# Patient Record
Sex: Male | Born: 1975 | Hispanic: No | Marital: Married | Smoking: Never smoker
Health system: Southern US, Community
[De-identification: ages and names within clinical notes are randomized; demographics above are authoritative.]

## PROBLEM LIST (undated history)

## (undated) HISTORY — PX: INNER EAR SURGERY: SHX679

---

## 2019-07-12 ENCOUNTER — Encounter (HOSPITAL_COMMUNITY): Payer: Self-pay | Admitting: Emergency Medicine

## 2019-07-12 ENCOUNTER — Emergency Department (HOSPITAL_COMMUNITY)
Admission: EM | Admit: 2019-07-12 | Discharge: 2019-07-12 | Disposition: A | Discharge status: Home / Self Care | Payer: Self-pay | Attending: Emergency Medicine | Admitting: Emergency Medicine

## 2019-07-12 ENCOUNTER — Emergency Department (HOSPITAL_COMMUNITY): Payer: Self-pay

## 2019-07-12 ENCOUNTER — Other Ambulatory Visit: Payer: Self-pay

## 2019-07-12 DIAGNOSIS — R05 Cough: Secondary | ICD-10-CM

## 2019-07-12 DIAGNOSIS — U071 COVID-19: Secondary | ICD-10-CM | POA: Insufficient documentation

## 2019-07-12 DIAGNOSIS — R0602 Shortness of breath: Secondary | ICD-10-CM | POA: Insufficient documentation

## 2019-07-12 DIAGNOSIS — R059 Cough, unspecified: Secondary | ICD-10-CM

## 2019-07-12 MED ORDER — HYDROCOD POLST-CPM POLST ER 10-8 MG/5ML PO SUER
5.0000 mL | Freq: Once | ORAL | Status: AC
  Administered 2019-07-12: 5 mL via ORAL
  Filled 2019-07-12: qty 5

## 2019-07-12 MED ORDER — HYDROCOD POLST-CPM POLST ER 10-8 MG/5ML PO SUER: 5.0000 mL | Freq: Two times a day (BID) | ORAL | 0 refills | Status: DC | PRN

## 2019-07-12 MED ORDER — ALBUTEROL SULFATE HFA 108 (90 BASE) MCG/ACT IN AERS
2.0000 | INHALATION_SPRAY | Freq: Once | RESPIRATORY_TRACT | Status: AC
  Administered 2019-07-12: 2 via RESPIRATORY_TRACT
  Filled 2019-07-12: qty 6.7

## 2019-07-12 MED ORDER — PREDNISONE 20 MG PO TABS: 60.0000 mg | ORAL_TABLET | Freq: Every day | ORAL | 0 refills | Status: DC

## 2019-07-12 MED ORDER — LEVOFLOXACIN 500 MG PO TABS: 500.0000 mg | ORAL_TABLET | Freq: Every day | ORAL | 0 refills | Status: AC

## 2019-07-12 MED ORDER — PREDNISONE 20 MG PO TABS: 60.0000 mg | ORAL_TABLET | Freq: Every day | ORAL | 0 refills | Status: AC

## 2019-07-12 MED ORDER — HYDROCOD POLST-CPM POLST ER 10-8 MG/5ML PO SUER: 5.0000 mL | Freq: Two times a day (BID) | ORAL | 0 refills | Status: AC | PRN

## 2019-07-12 NOTE — Discharge Instructions (Signed)
Use the Albuterol inhaler 2 puffs every 4 hours for cough if this provides relief. We are covering you with antibiotics as a cautious measure given your symptoms are new, developing one week after your diagnosis of COVID. You can also take the prednisone for further management of symptoms as prescribed.   Please return to the emergency department with any worsening symptoms. Otherwise, if your cough is no better in 3 days, see your doctor for recheck.

## 2019-07-12 NOTE — ED Triage Notes (Signed)
Pt reports having increasing cough and shortness of breath for the last week after being dx with COVID on 07/02/2019.

## 2019-07-12 NOTE — ED Provider Notes (Signed)
[facility]-EMERGENCY DEPT Provider Note   CSN: [id number] Arrival date & time: 07/12/19  0143     History Chief Complaint  Patient presents with  . Cough  . Shortness of Breath    Brian Andersen is a 44 y.o. male.  Patient to ED with complaint of cough and SOB that started yesterday and is worse today. He was diagnosed with COVID on 07/02/19, reporting symptoms at that time of body aches and fever which have resolved. He had no breathing difficulties until yesterday. No vomiting. He is using OTC cough syrup without relief. Cough is worse with deep inhalation. No chest pain.  The history is provided by the patient. No language interpreter was used.  Cough Associated symptoms: shortness of breath   Associated symptoms: no chest pain, no fever and no myalgias   Shortness of Breath Associated symptoms: cough   Associated symptoms: no chest pain and no fever        History reviewed. No pertinent past medical history.  There are no problems to display for this patient.      No family history on file.  Social History   Tobacco Use  . Smoking status: Never Smoker  . Smokeless tobacco: Never Used  Substance Use Topics  . Alcohol use: Never  . Drug use: Never    Home Medications Prior to Admission medications   Not on File    Allergies    Patient has no known allergies.  Review of Systems   Review of Systems  Constitutional: Negative for fever.  HENT: Negative for congestion.   Respiratory: Positive for cough and shortness of breath.   Cardiovascular: Negative for chest pain.  Gastrointestinal: Negative for nausea.  Musculoskeletal: Negative for myalgias.    Physical Exam Updated Vital Signs BP 107/75 (BP Location: Left Arm)   Pulse 89   Temp 99 F (37.2 C) (Oral)   Resp 19   Ht 5\' 3"  (1.6 m)   Wt 59 kg   SpO2 98%   BMI 23.03 kg/m   Physical Exam Vitals and nursing note reviewed.  Constitutional:      Appearance: He is  well-developed.  HENT:     Head: Normocephalic.  Cardiovascular:     Rate and Rhythm: Normal rate and regular rhythm.  Pulmonary:     Effort: Pulmonary effort is normal.     Breath sounds: Normal breath sounds. No wheezing, rhonchi or rales.     Comments: Actively coughing on exam Abdominal:     General: Bowel sounds are normal.     Palpations: Abdomen is soft.     Tenderness: There is no abdominal tenderness. There is no guarding or rebound.  Musculoskeletal:        General: Normal range of motion.     Cervical back: Normal range of motion and neck supple.  Skin:    General: Skin is warm and dry.     Findings: No rash.  Neurological:     Mental Status: He is alert and oriented to person, place, and time.     ED Results / Procedures / Treatments   Labs (all labs ordered are listed, but only abnormal results are displayed) Labs Reviewed - No data to display  EKG None  Radiology DG Chest Portable 1 View  Result Date: 07/12/2019 CLINICAL DATA:  COVID. Shortness of breath. EXAM: PORTABLE CHEST 1 VIEW COMPARISON:  None. FINDINGS: The cardiomediastinal contours are normal. Minimal vague opacities in the left greater than right mid  lower lung zone. Pulmonary vasculature is normal. No confluent consolidation, pleural effusion, or pneumothorax. No acute osseous abnormalities are seen. IMPRESSION: Minimal vague opacities in the left greater than right mid lower lung zone, may reflect atelectasis or pneumonia in the setting of COVID-19. Electronically Signed   By: Keith Rake M.D.   On: 07/12/2019 03:27    Procedures Procedures (including critical care time)  Medications Ordered in ED Medications  albuterol (VENTOLIN HFA) 108 (90 Base) MCG/ACT inhaler 2 puff (has no administration in time range)    ED Course  I have reviewed the triage vital signs and the nursing notes.  Pertinent labs & imaging results that were available during my care of the patient were reviewed by me  and considered in my medical decision making (see chart for details).    MDM Rules/Calculators/A&P                      Patient to ED with symptoms of cough, SOB x 2 days. COVID+ on 07/02/19 with symptoms of fever and body aches only, which have resolved.   He is well appearing, no hypoxia. Will provide albuterol inhaler to treat symptoms and reassess.   Patient is able to take a breath without coughing after Albuterol use. He feels improved. No hypoxia. Ambulatory in the room without desaturation. No tachycardia.   Will cover with abx given new onset cough. Rx tussionex, levaquin and prednisone provided. Return precautions discussed.   1. Cough  Final Clinical Impression(s) / ED Diagnoses Final diagnoses:  None       Rx / DC Orders ED Discharge Orders    None       Dennie Bible 07/12/19 0506    Palumbo, April, MD 07/12/19 0510

## 2019-09-19 ENCOUNTER — Ambulatory Visit: Payer: Self-pay | Attending: Internal Medicine

## 2019-09-19 DIAGNOSIS — Z23 Encounter for immunization: Secondary | ICD-10-CM

## 2019-09-19 NOTE — Progress Notes (Signed)
° °  Covid-19 Vaccination Clinic  Name:  Brian Andersen    MRN: [id number] DOB: [birth year]  09/19/2019  [name] was observed post Covid-19 immunization for 15 minutes without incident. He was provided with Vaccine Information Sheet and instruction to access the V-Safe system.   [name] was instructed to call 911 with any severe reactions post vaccine:  Difficulty breathing   Swelling of face and throat   A fast heartbeat   A bad rash all over body   Dizziness and weakness   Immunizations Administered    Name Date Dose VIS Date Route   Pfizer COVID-19 Vaccine 09/19/2019  1:15 PM 0.3 mL 06/03/2019 Intramuscular   Manufacturer: [employer], [employer]   Lot: [id number]   NDC: [address]-0

## 2019-10-11 ENCOUNTER — Ambulatory Visit: Payer: Self-pay | Attending: Internal Medicine

## 2019-10-11 DIAGNOSIS — Z23 Encounter for immunization: Secondary | ICD-10-CM

## 2019-10-11 NOTE — Progress Notes (Signed)
   Covid-19 Vaccination Clinic  Name:  Brian Andersen    MRN: [id number] DOB: 05-15-1976  10/11/2019  Mr. Brian Andersen was observed post Covid-19 immunization for 15 minutes without incident. He was provided with Vaccine Information Sheet and instruction to access the V-Safe system.   Mr. Brian Andersen was instructed to call 911 with any severe reactions post vaccine: Marland Kitchen Difficulty breathing  . Swelling of face and throat  . A fast heartbeat  . A bad rash all over body  . Dizziness and weakness   Immunizations Administered    Name Date Dose VIS Date Route   Pfizer COVID-19 Vaccine 10/11/2019  1:56 PM 0.3 mL 08/17/2018 Intramuscular   Manufacturer: [employer], [employer]   Lot: [id number]   NDC: [address]-1

## 2021-02-24 IMAGING — DX DG CHEST 1V PORT
1 series · 1 of 1 positions shown · non-contrast
Comparison: None.

CLINICAL DATA: COVID. Shortness of breath.

EXAM:
PORTABLE CHEST 1 VIEW

[chest ap]
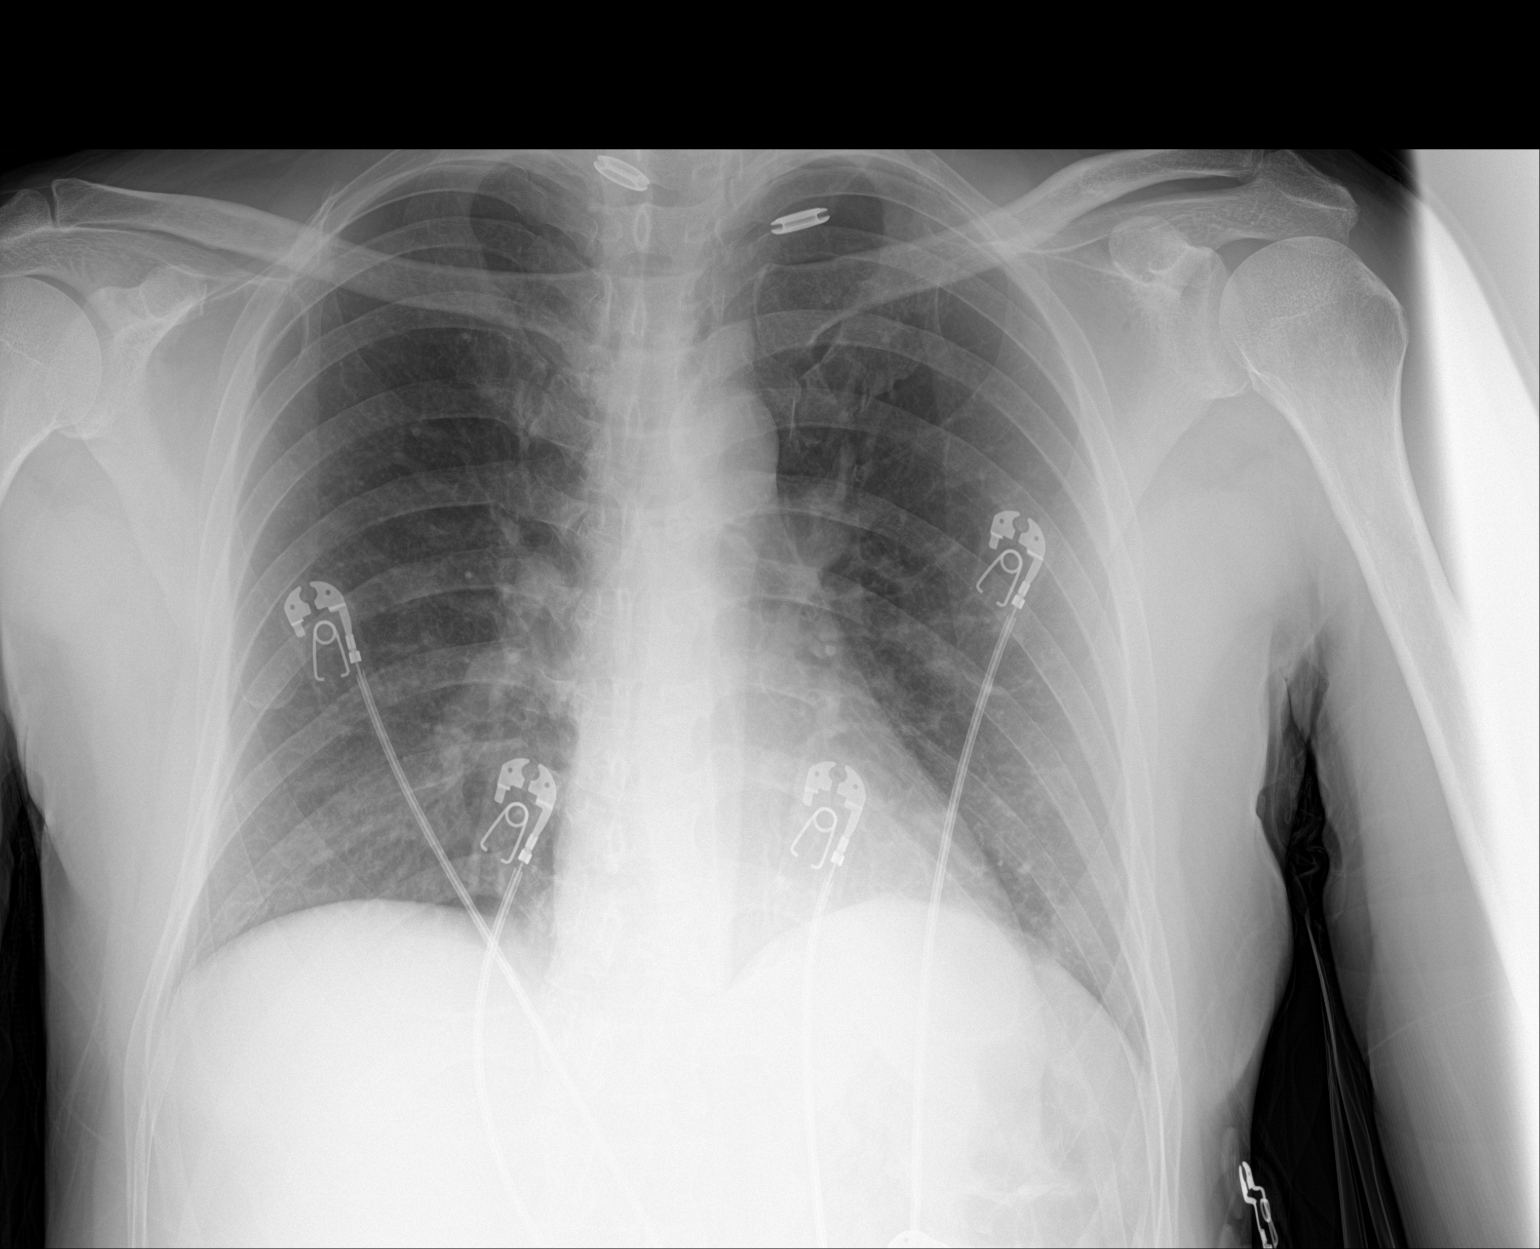

[1 of 1 positions shown; findings below may reference images not displayed]

FINDINGS: The cardiomediastinal contours are normal. Minimal vague opacities
in the left greater than right mid lower lung zone. Pulmonary
vasculature is normal. No confluent consolidation, pleural effusion,
or pneumothorax. No acute osseous abnormalities are seen.
IMPRESSION: Minimal vague opacities in the left greater than right mid lower
lung zone, may reflect atelectasis or pneumonia in the setting of
[id number].
